# Patient Record
Sex: Female | Born: 1965 | Race: White | Hispanic: No | Marital: Married | Smoking: Never smoker
Health system: Southern US, Community
[De-identification: ages and names within clinical notes are randomized; demographics above are authoritative.]

---

## 2002-01-18 ENCOUNTER — Other Ambulatory Visit: Admission: RE | Admit: 2002-01-18 | Discharge: 2002-01-18 | Payer: Self-pay | Admitting: Obstetrics and Gynecology

## 2003-09-03 ENCOUNTER — Other Ambulatory Visit: Admission: RE | Admit: 2003-09-03 | Discharge: 2003-09-03 | Payer: Self-pay | Admitting: Obstetrics and Gynecology

## 2005-08-05 ENCOUNTER — Other Ambulatory Visit: Admission: RE | Admit: 2005-08-05 | Discharge: 2005-08-05 | Payer: Self-pay | Admitting: Obstetrics and Gynecology

## 2005-08-24 ENCOUNTER — Encounter: Admission: RE | Admit: 2005-08-24 | Discharge: 2005-08-24 | Payer: Self-pay | Admitting: Obstetrics and Gynecology

## 2006-11-01 ENCOUNTER — Other Ambulatory Visit: Admission: RE | Admit: 2006-11-01 | Discharge: 2006-11-01 | Payer: Self-pay | Admitting: Obstetrics and Gynecology

## 2009-04-02 ENCOUNTER — Ambulatory Visit (HOSPITAL_COMMUNITY): Admission: RE | Admit: 2009-04-02 | Discharge: 2009-04-02 | Payer: Self-pay | Admitting: Dermatology

## 2011-04-15 ENCOUNTER — Emergency Department (INDEPENDENT_AMBULATORY_CARE_PROVIDER_SITE_OTHER): Payer: BC Managed Care – PPO

## 2011-04-15 ENCOUNTER — Emergency Department (HOSPITAL_BASED_OUTPATIENT_CLINIC_OR_DEPARTMENT_OTHER)
Admission: EM | Admit: 2011-04-15 | Discharge: 2011-04-16 | Disposition: A | Discharge status: Home / Self Care | Payer: BC Managed Care – PPO | Attending: Emergency Medicine | Admitting: Emergency Medicine

## 2011-04-15 ENCOUNTER — Encounter: Payer: Self-pay | Admitting: *Deleted

## 2011-04-15 DIAGNOSIS — W108XXA Fall (on) (from) other stairs and steps, initial encounter: Secondary | ICD-10-CM

## 2011-04-15 DIAGNOSIS — Y92009 Unspecified place in unspecified non-institutional (private) residence as the place of occurrence of the external cause: Secondary | ICD-10-CM | POA: Insufficient documentation

## 2011-04-15 DIAGNOSIS — W19XXXA Unspecified fall, initial encounter: Secondary | ICD-10-CM

## 2011-04-15 DIAGNOSIS — S52209A Unspecified fracture of shaft of unspecified ulna, initial encounter for closed fracture: Secondary | ICD-10-CM | POA: Insufficient documentation

## 2011-04-15 DIAGNOSIS — S0180XA Unspecified open wound of other part of head, initial encounter: Secondary | ICD-10-CM

## 2011-04-15 DIAGNOSIS — S0003XA Contusion of scalp, initial encounter: Secondary | ICD-10-CM | POA: Insufficient documentation

## 2011-04-15 DIAGNOSIS — S0181XA Laceration without foreign body of other part of head, initial encounter: Secondary | ICD-10-CM

## 2011-04-15 NOTE — ED Notes (Signed)
Pt states she fell down some steps, POS LOC, right fore arm pain and laceration to right forehead

## 2011-04-15 NOTE — ED Notes (Signed)
Pt states 5 glasses of wine this pm

## 2011-04-16 MED ORDER — OXYCODONE-ACETAMINOPHEN 5-325 MG PO TABS: 2.0000 | ORAL_TABLET | ORAL | Status: AC | PRN

## 2011-04-16 NOTE — ED Provider Notes (Signed)
History     CSN: [id number] Arrival date & time: 04/15/2011 11:19 PM  Chief Complaint  Patient presents with  . Fall   Patient is a 45 y.o. female presenting with fall.  Fall The accident occurred less than 1 hour ago. She landed on a hard floor. The point of impact was the head (right forearm). She was ambulatory at the scene. Pertinent negatives include no numbness, no abdominal pain, no nausea, no vomiting and no headaches.  patient states that she tripped and fell down the stairs. Complaining of pain in her right forearm. Small laceration to her head. No LOC. She states that she has been drinking. No chest or abdominal pain. She thinks that her tetanus is up to date.   History reviewed. No pertinent past medical history.  History reviewed. No pertinent past surgical history.  History reviewed. No pertinent family history.  History  Substance Use Topics  . Smoking status: Never Smoker   . Smokeless tobacco: Not on file  . Alcohol Use: No    OB History    Grav Para Term Preterm Abortions TAB SAB Ect Mult Living                  Review of Systems  Constitutional: Negative for activity change and appetite change.  HENT: Negative for facial swelling, neck pain and neck stiffness.   Eyes: Negative for pain.  Respiratory: Negative for chest tightness and shortness of breath.   Cardiovascular: Negative for chest pain and leg swelling.  Gastrointestinal: Negative for nausea, vomiting, abdominal pain and diarrhea.  Genitourinary: Negative for flank pain.  Musculoskeletal: Negative for back pain and joint swelling.       Right forearm pain   Skin: Negative for rash.  Neurological: Negative for weakness, numbness and headaches.  Psychiatric/Behavioral: Negative for behavioral problems.    Physical Exam  BP 112/76  Pulse 77  Temp(Src) 97.7 F (36.5 C) (Oral)  Resp 16  Wt 148 lb (67.132 kg)  SpO2 100%  Physical Exam  Nursing note and vitals reviewed. Constitutional: She  is oriented to person, place, and time. She appears well-developed and well-nourished.  HENT:  Head: Normocephalic.       4mm laceration  To right forehead byrigh harline. No crepitance.   Eyes: EOM are normal. Pupils are equal, round, and reactive to light.  Neck: Normal range of motion. Neck supple.  Cardiovascular: Normal rate, regular rhythm and normal heart sounds.   No murmur heard. Pulmonary/Chest: Effort normal and breath sounds normal. No respiratory distress. She has no wheezes. She has no rales.  Abdominal: Soft. Bowel sounds are normal. She exhibits no distension. There is no tenderness. There is no rebound and no guarding.  Musculoskeletal:       Tender right ulna. Skin intact. NV intact distally.   Neurological: She is alert and oriented to person, place, and time. No cranial nerve deficit.  Skin: Skin is warm and dry.  Psychiatric: She has a normal mood and affect. Her speech is normal.    ED Course  LACERATION REPAIR Date/Time: 04/16/2011 1:28 AM Performed by: Benjiman Core R. Authorized by: Billee Cashing Consent: Verbal consent obtained. Risks and benefits: risks, benefits and alternatives were discussed Consent given by: patient and spouse Patient understanding: patient states understanding of the procedure being performed Patient consent: the patient's understanding of the procedure matches consent given Procedure consent: procedure consent matches procedure scheduled Relevant documents: relevant documents present and verified Test results: test results available  and properly labeled Site marked: the operative site was not marked Required items: required blood products, implants, devices, and special equipment available Patient identity confirmed: verbally with patient Time out: Immediately prior to procedure a "time out" was called to verify the correct patient, procedure, equipment, support staff and site/side marked as required. Body area:  head/neck Location details: forehead Laceration length: 0.4 cm Foreign bodies: glass Tendon involvement: none Nerve involvement: none Vascular damage: no Patient sedated: no Irrigation solution: saline Irrigation method: syringe Debridement: none Degree of undermining: none Skin closure: glue Technique: simple Approximation: close Patient tolerance: Patient tolerated the procedure well with no immediate complications.    No results found for this or any previous visit. Dg Forearm Right  04/15/2011  *RADIOLOGY REPORT*  Clinical Data: Larey Seat.  Injured right forearm.  RIGHT FOREARM - 2 VIEW  Comparison: None  Findings: There is a minimally-displaced are comminuted fracture involving the distal ulnar shaft at the middle third distal third junction.  No fracture of the radius.  The radiocapitellar joint spaces maintained.  IMPRESSION: Distal ulnar shaft fracture.  Original Report Authenticated By: P. Loralie Champagne, M.D.   Ct Head Wo Contrast  04/15/2011  *RADIOLOGY REPORT*  Clinical Data: Fall down steps, laceration to right forehead, no loss of consciousness  CT HEAD WITHOUT CONTRAST  Technique:  Contiguous axial images were obtained from the base of the skull through the vertex without contrast.  Comparison: None.  Findings: No evidence of parenchymal hemorrhage or extra-axial fluid collection. No mass lesion, mass effect, or midline shift.  No CT evidence of acute infarction.  Cerebral volume is age appropriate.  No ventriculomegaly.  The visualized paranasal sinuses are essentially clear. The mastoid air cells are unopacified.  Tiny subcutaneous radiodensity overlying the right frontal bone (series 3/image 13).  No evidence of calvarial fracture.  IMPRESSION: Tiny subcutaneous radiodensity overlying the right frontal bone, correlate for radiopaque foreign body.  Otherwise, no evidence of acute intracranial abnormality.  Original Report Authenticated By: Charline Bills, M.D.    Fall. Ulnar  fracture. Splinted. Laceration to forehead. D[place]ond. Ct had FB. Likely glass and likely irrigated out. Hand follow up.      Juliet Rude. Rubin Payor, MD 04/16/11 0130

## 2013-06-07 ENCOUNTER — Emergency Department (HOSPITAL_BASED_OUTPATIENT_CLINIC_OR_DEPARTMENT_OTHER)
Admission: EM | Admit: 2013-06-07 | Discharge: 2013-06-07 | Disposition: A | Discharge status: Home / Self Care | Payer: BC Managed Care – PPO | Attending: Emergency Medicine | Admitting: Emergency Medicine

## 2013-06-07 ENCOUNTER — Encounter (HOSPITAL_BASED_OUTPATIENT_CLINIC_OR_DEPARTMENT_OTHER): Payer: Self-pay | Admitting: Emergency Medicine

## 2013-06-07 ENCOUNTER — Emergency Department (HOSPITAL_BASED_OUTPATIENT_CLINIC_OR_DEPARTMENT_OTHER): Payer: BC Managed Care – PPO

## 2013-06-07 DIAGNOSIS — S161XXA Strain of muscle, fascia and tendon at neck level, initial encounter: Secondary | ICD-10-CM

## 2013-06-07 DIAGNOSIS — S139XXA Sprain of joints and ligaments of unspecified parts of neck, initial encounter: Secondary | ICD-10-CM | POA: Insufficient documentation

## 2013-06-07 DIAGNOSIS — Y9389 Activity, other specified: Secondary | ICD-10-CM | POA: Insufficient documentation

## 2013-06-07 DIAGNOSIS — Z79899 Other long term (current) drug therapy: Secondary | ICD-10-CM | POA: Insufficient documentation

## 2013-06-07 DIAGNOSIS — Y92410 Unspecified street and highway as the place of occurrence of the external cause: Secondary | ICD-10-CM | POA: Insufficient documentation

## 2013-06-07 MED ORDER — CYCLOBENZAPRINE HCL 10 MG PO TABS: 5.0000 mg | ORAL_TABLET | Freq: Two times a day (BID) | ORAL | Status: AC | PRN

## 2013-06-07 MED ORDER — HYDROCODONE-ACETAMINOPHEN 5-325 MG PO TABS: 1.0000 | ORAL_TABLET | ORAL | Status: AC | PRN

## 2013-06-07 MED ORDER — HYDROCODONE-ACETAMINOPHEN 5-325 MG PO TABS
1.0000 | ORAL_TABLET | Freq: Once | ORAL | Status: AC
  Administered 2013-06-07: 1 via ORAL
  Filled 2013-06-07: qty 1

## 2013-06-07 MED ORDER — CYCLOBENZAPRINE HCL 10 MG PO TABS
5.0000 mg | ORAL_TABLET | Freq: Once | ORAL | Status: AC
  Administered 2013-06-07: 5 mg via ORAL
  Filled 2013-06-07: qty 1
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
## 2013-06-08 NOTE — ED Provider Notes (Signed)
Medical screening examination/treatment/procedure(s) were performed by non-physician practitioner and as supervising physician I was immediately available for consultation/collaboration.  Courtney F Horton, MD 06/08/13 0928 

## 2014-10-18 ENCOUNTER — Other Ambulatory Visit: Payer: Self-pay | Admitting: Obstetrics and Gynecology

## 2014-10-18 DIAGNOSIS — R928 Other abnormal and inconclusive findings on diagnostic imaging of breast: Secondary | ICD-10-CM

## 2014-10-26 ENCOUNTER — Other Ambulatory Visit: Payer: BC Managed Care – PPO

## 2014-10-30 ENCOUNTER — Encounter (INDEPENDENT_AMBULATORY_CARE_PROVIDER_SITE_OTHER): Payer: Self-pay

## 2014-10-30 ENCOUNTER — Ambulatory Visit
Admission: RE | Admit: 2014-10-30 | Discharge: 2014-10-30 | Disposition: A | Discharge status: Home / Self Care | Payer: BC Managed Care – PPO | Source: Ambulatory Visit | Attending: Obstetrics and Gynecology | Admitting: Obstetrics and Gynecology

## 2014-10-30 DIAGNOSIS — R928 Other abnormal and inconclusive findings on diagnostic imaging of breast: Secondary | ICD-10-CM

## 2020-05-07 ENCOUNTER — Ambulatory Visit: Payer: Self-pay | Admitting: Allergy and Immunology

## 2020-10-14 ENCOUNTER — Emergency Department (HOSPITAL_BASED_OUTPATIENT_CLINIC_OR_DEPARTMENT_OTHER)
Admission: EM | Admit: 2020-10-14 | Discharge: 2020-10-14 | Disposition: A | Discharge status: Home / Self Care | Payer: No Typology Code available for payment source | Attending: Emergency Medicine | Admitting: Emergency Medicine

## 2020-10-14 ENCOUNTER — Encounter (HOSPITAL_BASED_OUTPATIENT_CLINIC_OR_DEPARTMENT_OTHER): Payer: Self-pay | Admitting: Emergency Medicine

## 2020-10-14 ENCOUNTER — Other Ambulatory Visit: Payer: Self-pay

## 2020-10-14 ENCOUNTER — Emergency Department (HOSPITAL_BASED_OUTPATIENT_CLINIC_OR_DEPARTMENT_OTHER): Payer: No Typology Code available for payment source

## 2020-10-14 DIAGNOSIS — W19XXXA Unspecified fall, initial encounter: Secondary | ICD-10-CM

## 2020-10-14 DIAGNOSIS — S0990XA Unspecified injury of head, initial encounter: Secondary | ICD-10-CM

## 2020-10-14 DIAGNOSIS — W009XXA Unspecified fall due to ice and snow, initial encounter: Secondary | ICD-10-CM | POA: Diagnosis not present

## 2020-10-14 MED ORDER — ACETAMINOPHEN 325 MG PO TABS
650.0000 mg | ORAL_TABLET | Freq: Once | ORAL | Status: AC
  Administered 2020-10-14: 650 mg via ORAL
  Filled 2020-10-14: qty 2

## 2020-10-14 NOTE — ED Provider Notes (Signed)
[facility] EMERGENCY DEPARTMENT Provider Note   CSN: [id number] Arrival date & time: 10/14/20  0920     History Chief Complaint  Patient presents with  . Fall    Kylie Little is a 55 y.o. female.  About 7 this morning patient slipped on the ice hit her bottom and then hit the back of her head no loss of consciousness but she is dizzy and feeling foggy.  No nausea or vomiting.  Did have some blurred vision denies any numbness or tingling to her lower extremities or upper extremities.  She did hit the back of her head and has a bump there.  No low back pain no hip or buttocks pain.  Patient was able to walk to the room.        History reviewed. No pertinent past medical history.  There are no problems to display for this patient.   History reviewed. No pertinent surgical history.   OB History   No obstetric history on file.     No family history on file.  Social History   Tobacco Use  . Smoking status: Never Smoker  . Smokeless tobacco: Never Used  Substance Use Topics  . Alcohol use: Yes  . Drug use: No    Home Medications Prior to Admission medications   Medication Sig Start Date End Date Taking? Authorizing Provider  cetirizine (ZYRTEC) 10 MG tablet Take 10 mg by mouth daily.      [provider]  cyclobenzaprine (FLEXERIL) 10 MG tablet Take 0.5 tablets (5 mg total) by mouth 2 (two) times daily as needed for muscle spasms. 06/07/13   Teressa Lower, NP  HYDROcodone-acetaminophen (NORCO/VICODIN) 5-325 MG per tablet Take 1 tablet by mouth every 4 (four) hours as needed for pain. 06/07/13   Teressa Lower, NP  Multiple Vitamin (MULTIVITAMIN) tablet Take 1 tablet by mouth daily.      [provider]  tetrahydrozoline-zinc (VISINE-AC) 0.05-0.25 % ophthalmic solution Place 2 drops into the left eye once.      [provider]    Allergies    Patient has no known allergies.  Review of Systems   Review of Systems   Constitutional: Negative for chills and fever.  HENT: Negative for congestion, rhinorrhea and sore throat.   Eyes: Positive for visual disturbance.  Respiratory: Negative for cough and shortness of breath.   Cardiovascular: Negative for chest pain and leg swelling.  Gastrointestinal: Negative for abdominal pain, diarrhea, nausea and vomiting.  Genitourinary: Negative for dysuria.  Musculoskeletal: Negative for back pain and neck pain.  Skin: Negative for rash.  Neurological: Positive for dizziness and headaches. Negative for light-headedness.  Hematological: Does not bruise/bleed easily.  Psychiatric/Behavioral: Negative for confusion.    Physical Exam Updated Vital Signs BP (!) 160/94 (BP Location: Right Arm)   Pulse 81   Temp 98.1 F (36.7 C) (Oral)   Resp 20   Ht 1.676 m (5\' 6" )   Wt 72.6 kg   SpO2 99%   BMI 25.82 kg/m   Physical Exam Vitals and nursing note reviewed.  Constitutional:      General: She is not in acute distress.    Appearance: Normal appearance. She is well-developed and well-nourished.  HENT:     Head: Normocephalic.     Comments: Patient with some swelling to the back of the head no bleeding. Eyes:     Conjunctiva/sclera: Conjunctivae normal.     Pupils: Pupils are equal, round, and reactive to light.  Neck:     Comments: Mild tenderness to palpation to the posterior cervical spine. Cardiovascular:     Rate and Rhythm: Normal rate and regular rhythm.     Heart sounds: No murmur heard.   Pulmonary:     Effort: Pulmonary effort is normal. No respiratory distress.     Breath sounds: Normal breath sounds.  Abdominal:     Palpations: Abdomen is soft.     Tenderness: There is no abdominal tenderness.  Musculoskeletal:        General: No edema. Normal range of motion.     Cervical back: Normal range of motion and neck supple. Tenderness present. No rigidity.  Skin:    General: Skin is warm and dry.     Capillary Refill: Capillary refill takes  less than 2 seconds.  Neurological:     General: No focal deficit present.     Mental Status: She is alert and oriented to person, place, and time.     Cranial Nerves: No cranial nerve deficit.     Sensory: No sensory deficit.     Motor: No weakness.  Psychiatric:        Mood and Affect: Mood and affect normal.     ED Results / Procedures / Treatments   Labs (all labs ordered are listed, but only abnormal results are displayed) Labs Reviewed - No data to display  EKG None  Radiology CT Head Wo Contrast  Result Date: 10/14/2020 CLINICAL DATA:  Head trauma, moderate/severe. Poly trauma, critical, head/cervical spine injury suspected. Additional history provided: Patient reports slipping on ice and hitting bottom and then hitting head. Patient reports dizziness, "Foggy feeling, "and blurred vision. EXAM: CT HEAD WITHOUT CONTRAST CT CERVICAL SPINE WITHOUT CONTRAST TECHNIQUE: Multidetector CT imaging of the head and cervical spine was performed following the standard protocol without intravenous contrast. Multiplanar CT image reconstructions of the cervical spine were also generated. COMPARISON:  Head CT 04/15/2011. Radiographs of the cervical spine 06/07/2013. FINDINGS: CT HEAD FINDINGS Brain: Cerebral volume is normal. There is no acute intracranial hemorrhage. No demarcated cortical infarct. No extra-axial fluid collection. No evidence of intracranial mass. No midline shift. Vascular: No hyperdense vessel. Skull: Normal. Negative for fracture or focal lesion. Sinuses/Orbits: Visualized orbits show no acute finding. No significant paranasal sinus disease at the imaged levels CT CERVICAL SPINE FINDINGS Alignment: 2 mm C7-T1 grade 1 anterolisthesis. Skull base and vertebrae: The basion-dental and atlanto-dental intervals are maintained.No evidence of acute fracture to the cervical spine. Soft tissues and spinal canal: No prevertebral fluid or swelling. No visible canal hematoma. Disc levels:  Cervical spondylosis with multilevel disc space narrowing, disc bulges, posterior disc osteophytes, uncovertebral hypertrophy and facet arthrosis. Disc space narrowing is moderate at C5-C6 and moderate/advanced at C6-C7. A C6-C7 posterior disc osteophyte contributes to at least moderate spinal canal stenosis. Multilevel bony neural foraminal narrowing. Upper chest: No consolidation within the imaged lung apices. No visible pneumothorax. IMPRESSION: CT head: No evidence of acute intracranial abnormality. CT cervical spine: 1. No evidence of acute fracture to the cervical spine. 2. 2 mm C7-T1 grade 1 anterolisthesis. 3. Cervical spondylosis, as described. Electronically Signed   By: Jackey Loge DO   On: 10/14/2020 11:46   CT Cervical Spine Wo Contrast  Result Date: 10/14/2020 CLINICAL DATA:  Head trauma, moderate/severe. Poly trauma, critical, head/cervical spine injury suspected. Additional history provided: Patient reports slipping on ice and hitting bottom and then hitting head. Patient reports dizziness, "Foggy feeling, "and blurred vision. EXAM: CT HEAD  WITHOUT CONTRAST CT CERVICAL SPINE WITHOUT CONTRAST TECHNIQUE: Multidetector CT imaging of the head and cervical spine was performed following the standard protocol without intravenous contrast. Multiplanar CT image reconstructions of the cervical spine were also generated. COMPARISON:  Head CT 04/15/2011. Radiographs of the cervical spine 06/07/2013. FINDINGS: CT HEAD FINDINGS Brain: Cerebral volume is normal. There is no acute intracranial hemorrhage. No demarcated cortical infarct. No extra-axial fluid collection. No evidence of intracranial mass. No midline shift. Vascular: No hyperdense vessel. Skull: Normal. Negative for fracture or focal lesion. Sinuses/Orbits: Visualized orbits show no acute finding. No significant paranasal sinus disease at the imaged levels CT CERVICAL SPINE FINDINGS Alignment: 2 mm C7-T1 grade 1 anterolisthesis. Skull base and  vertebrae: The basion-dental and atlanto-dental intervals are maintained.No evidence of acute fracture to the cervical spine. Soft tissues and spinal canal: No prevertebral fluid or swelling. No visible canal hematoma. Disc levels: Cervical spondylosis with multilevel disc space narrowing, disc bulges, posterior disc osteophytes, uncovertebral hypertrophy and facet arthrosis. Disc space narrowing is moderate at C5-C6 and moderate/advanced at C6-C7. A C6-C7 posterior disc osteophyte contributes to at least moderate spinal canal stenosis. Multilevel bony neural foraminal narrowing. Upper chest: No consolidation within the imaged lung apices. No visible pneumothorax. IMPRESSION: CT head: No evidence of acute intracranial abnormality. CT cervical spine: 1. No evidence of acute fracture to the cervical spine. 2. 2 mm C7-T1 grade 1 anterolisthesis. 3. Cervical spondylosis, as described. Electronically Signed   By: Jackey Loge DO   On: 10/14/2020 11:46    Procedures Procedures  Medications Ordered in ED Medications  acetaminophen (TYLENOL) tablet 650 mg (650 mg Oral Given 10/14/20 1037)    ED Course  I have reviewed the triage vital signs and the nursing notes.  Pertinent labs & imaging results that were available during my care of the patient were reviewed by me and considered in my medical decision making (see chart for details).    MDM Rules/Calculators/A&P                          CT head neck without any acute findings.  Patient showing some symptoms of early concussion.  Patient neurologically stable here without any focal deficits.  But will take her out of work until the 12th.  Patient stable for discharge home.     Final Clinical Impression(s) / ED Diagnoses Final diagnoses:  Fall, initial encounter  Injury of head, initial encounter    Rx / DC Orders ED Discharge Orders    None       Vanetta Mulders, MD 10/14/20 1217

## 2020-10-14 NOTE — Discharge Instructions (Signed)
CT head and neck without any acute findings.  But you are showing some symptoms of concussion.  Work note provided to be out of work all week.  To light itself to heal.  Follow-up with your doctors or return here if symptoms or not improving at the end of the week.  Return for any new or worse symptoms.
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
## 2022-04-04 IMAGING — CT CT HEAD W/O CM
3 series · 14 of 46 positions shown, 16 images · non-contrast
Comparison: Head CT 04/15/2011. Radiographs of the cervical spine
06/07/2013.

CLINICAL DATA: Head trauma, moderate/severe. Poly trauma, critical,
head/cervical spine injury suspected. Additional history provided:
Patient reports slipping on ice and hitting bottom and then hitting
head. Patient reports dizziness, "Foggy feeling, "and blurred
vision.

EXAM:
CT HEAD WITHOUT CONTRAST
CT CERVICAL SPINE WITHOUT CONTRAST
TECHNIQUE: Multidetector CT imaging of the head and cervical spine was
performed following the standard protocol without intravenous
contrast. Multiplanar CT image reconstructions of the cervical spine
were also generated.

[Series 2: head 5.0 h30s · axial · 0.43mm/px · z∈[-129,-9]mm · 8 of 29 slices shown, 10 images]
[im 3/29  brain]
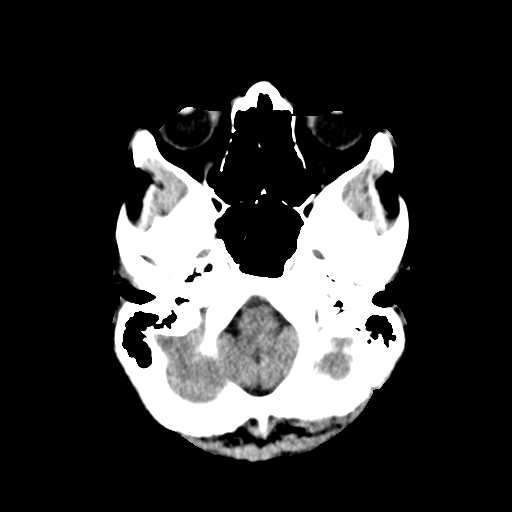
[im 3/29  bone]
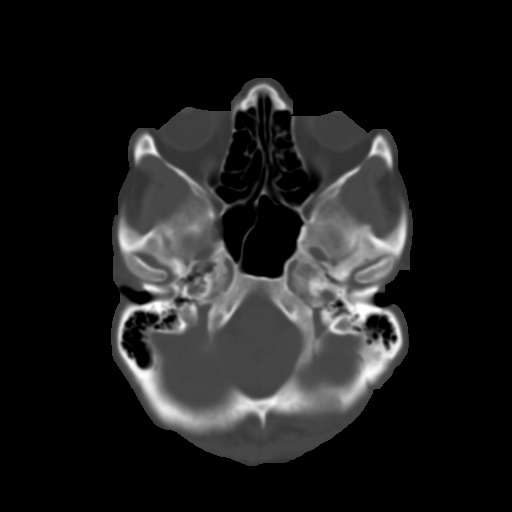
[im 7/29  brain]
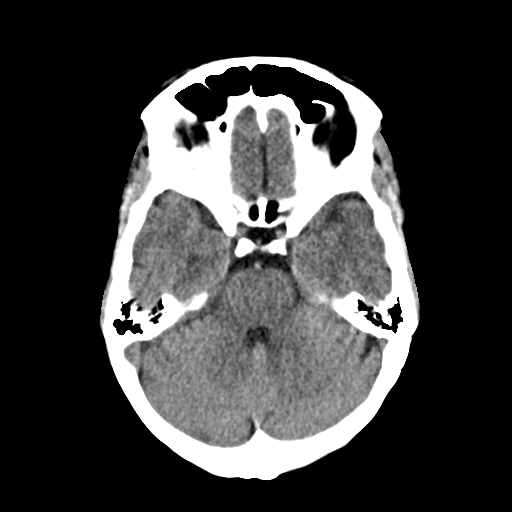
[im 10/29  brain]
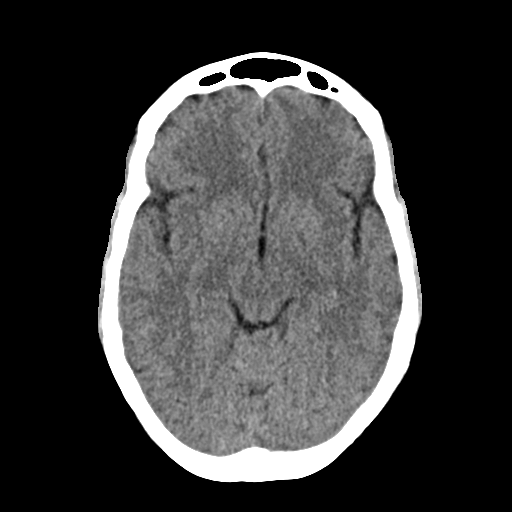
[im 13/29  brain]
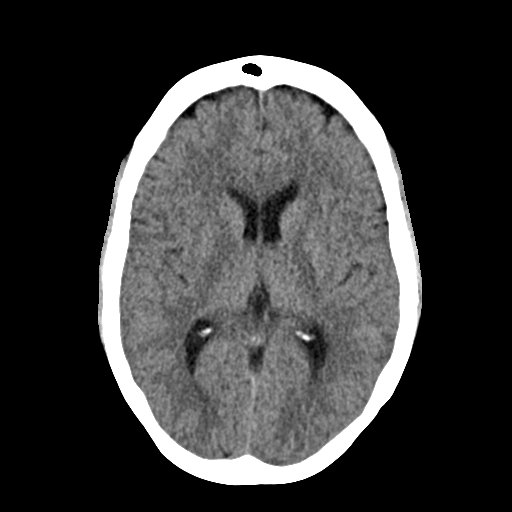
[im 17/29  brain]
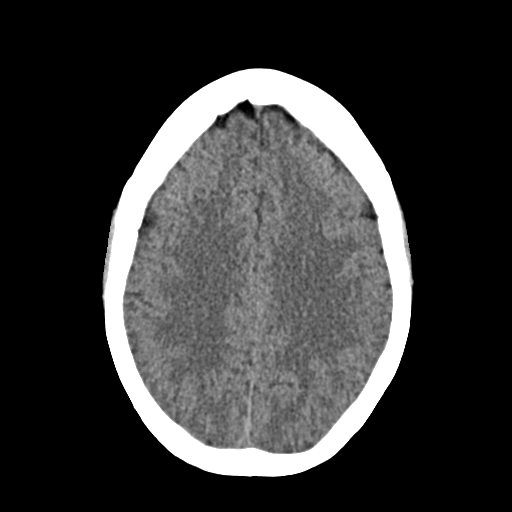
[im 17/29  bone]
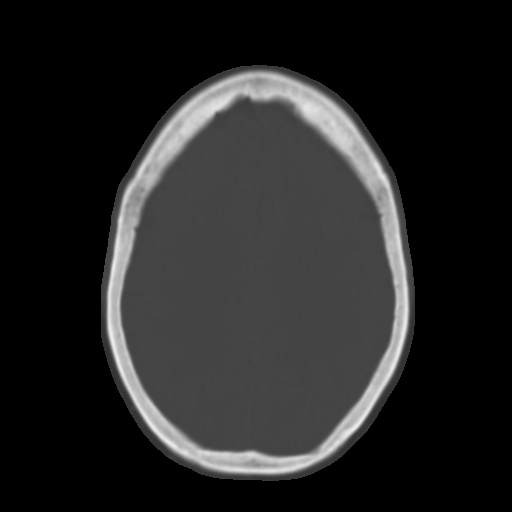
[im 20/29  brain]
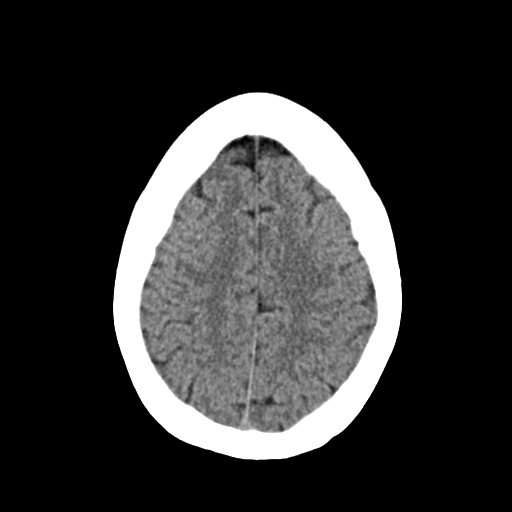
[im 23/29  brain]
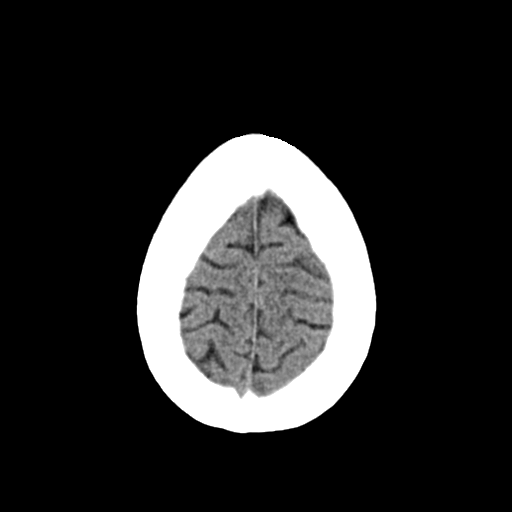
[im 27/29  brain]
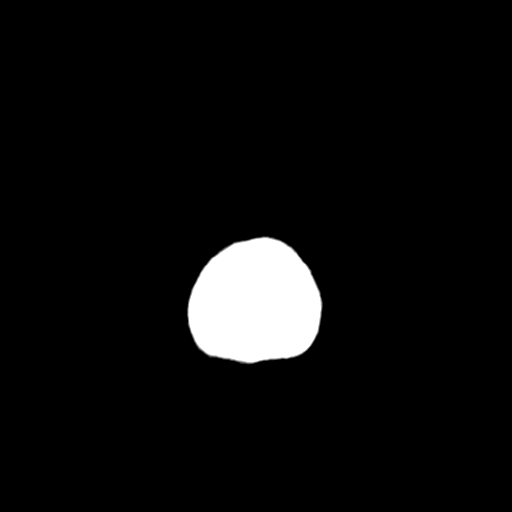

[Series 4: head 3.0 mpr cor · coronal · 0.30mm/px · 3 of 67 slices shown]
[im 23/67  brain]
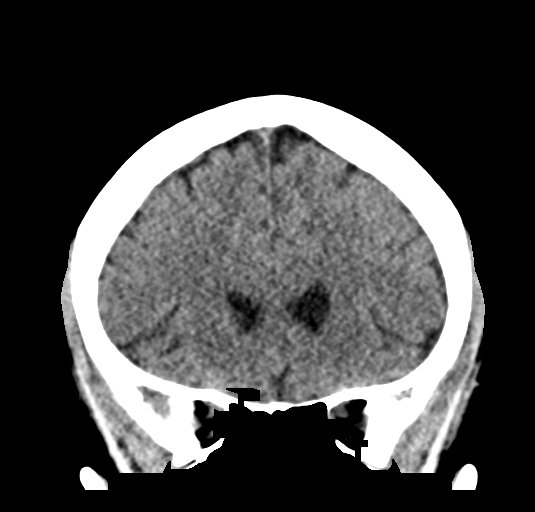
[im 30/67  brain]
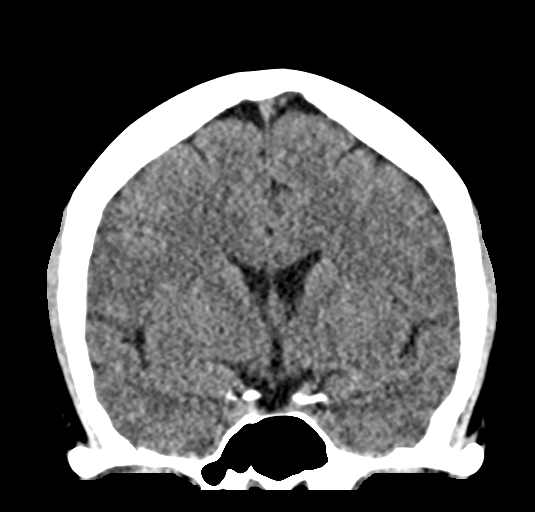
[im 37/67  brain]
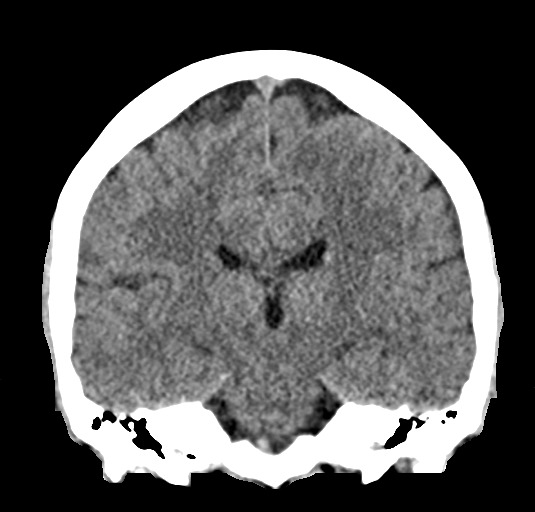

[Series 5: head 3.0 mpr sag · sagittal · 0.30mm/px · 3 of 50 slices shown]
[im 17/50  brain]
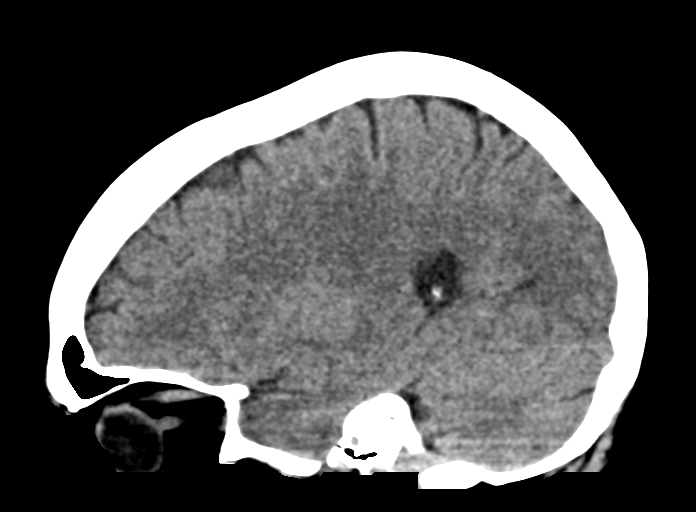
[im 25/50  brain]
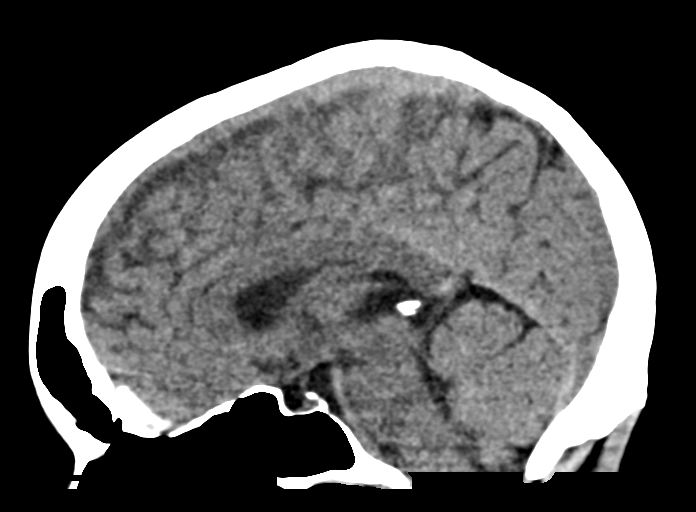
[im 33/50  brain]
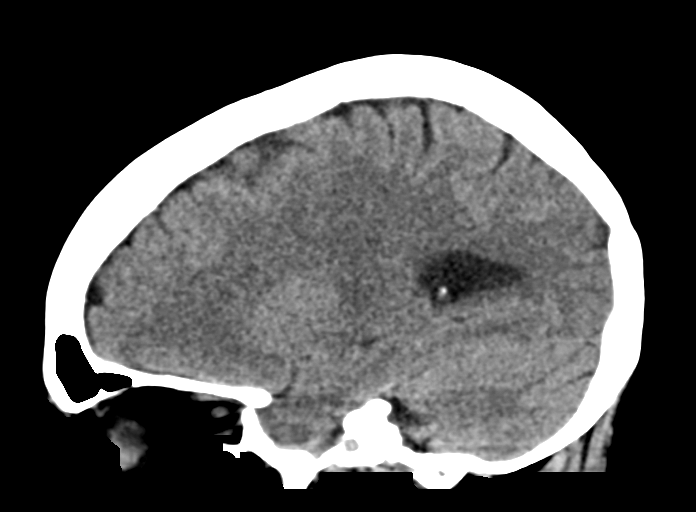

[14 of 46 positions shown; findings below may reference images not displayed]

FINDINGS: CT HEAD FINDINGS

Brain:

Cerebral volume is normal.

There is no acute intracranial hemorrhage.

No demarcated cortical infarct.

No extra-axial fluid collection.

No evidence of intracranial mass.

No midline shift.

Vascular: No hyperdense vessel.

Skull: Normal. Negative for fracture or focal lesion.

Sinuses/Orbits: Visualized orbits show no acute finding. No
significant paranasal sinus disease at the imaged levels

CT CERVICAL SPINE FINDINGS

Alignment: 2 mm C7-T1 grade 1 anterolisthesis.

Skull base and vertebrae: The basion-dental and atlanto-dental
intervals are maintained.No evidence of acute fracture to the
cervical spine.

Soft tissues and spinal canal: No prevertebral fluid or swelling. No
visible canal hematoma.

Disc levels: Cervical spondylosis with multilevel disc space
narrowing, disc bulges, posterior disc osteophytes, uncovertebral
hypertrophy and facet arthrosis. Disc space narrowing is moderate at
C5-C6 and moderate/advanced at C6-C7. A C6-C7 posterior disc
osteophyte contributes to at least moderate spinal canal stenosis.
Multilevel bony neural foraminal narrowing.

Upper chest: No consolidation within the imaged lung apices. No
visible pneumothorax.
IMPRESSION: CT head:

No evidence of acute intracranial abnormality.

CT cervical spine:

1. No evidence of acute fracture to the cervical spine.
2. 2 mm C7-T1 grade 1 anterolisthesis.
3. Cervical spondylosis, as described.

## 2023-06-01 ENCOUNTER — Ambulatory Visit
Admission: RE | Admit: 2023-06-01 | Discharge: 2023-06-01 | Disposition: A | Discharge status: Home / Self Care | Payer: Self-pay | Source: Ambulatory Visit | Attending: Family Medicine | Admitting: Family Medicine

## 2023-06-01 ENCOUNTER — Other Ambulatory Visit: Payer: Self-pay | Admitting: Family Medicine

## 2023-06-01 DIAGNOSIS — M542 Cervicalgia: Secondary | ICD-10-CM
